# Patient Record
Sex: Male | Born: 1960 | Hispanic: No
Health system: Southern US, Community
[De-identification: ages and names within clinical notes are randomized; demographics above are authoritative.]

---

## 2007-05-03 ENCOUNTER — Inpatient Hospital Stay (HOSPITAL_COMMUNITY): Admission: EM | Admit: 2007-05-03 | Discharge: 2007-05-04 | Payer: Self-pay

## 2007-06-28 ENCOUNTER — Emergency Department (HOSPITAL_COMMUNITY): Admission: EM | Admit: 2007-06-28 | Discharge: 2007-06-28 | Payer: Self-pay | Admitting: Emergency Medicine
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
## 2011-09-19 LAB — BASIC METABOLIC PANEL
BUN: 8
CO2: 23
Calcium: 9.2
Creatinine, Ser: 0.78
GFR calc non Af Amer: >60
Glucose, Bld: 104 — ABNORMAL HIGH
Sodium: 136

## 2011-09-19 LAB — DIFFERENTIAL
Basophils Absolute: 0.1
Basophils Relative: 1
Lymphocytes Relative: 10 — ABNORMAL LOW
Monocytes Absolute: 0.8 — ABNORMAL HIGH
Neutro Abs: 10 — ABNORMAL HIGH
Neutrophils Relative %: 82 — ABNORMAL HIGH

## 2011-09-19 LAB — CBC
Hemoglobin: 16.8
MCHC: 33.6
Platelets: 259
RDW: 15 — ABNORMAL HIGH

## 2016-10-10 ENCOUNTER — Telehealth: Payer: Self-pay | Admitting: Physician Assistant

## 2016-10-10 NOTE — Telephone Encounter (Signed)
Opened in error
